# Patient Record
Sex: Male | Born: 1985 | Race: White | Hispanic: No | Marital: Married | Smoking: Never smoker
Health system: Southern US, Community
[De-identification: ages and names within clinical notes are randomized; demographics above are authoritative.]

## PROBLEM LIST (undated history)

## (undated) DIAGNOSIS — K601 Chronic anal fissure: Secondary | ICD-10-CM

## (undated) DIAGNOSIS — M109 Gout, unspecified: Secondary | ICD-10-CM

## (undated) HISTORY — DX: Chronic anal fissure: K60.1

## (undated) HISTORY — DX: Gout, unspecified: M10.9

---
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
## 2010-11-09 ENCOUNTER — Emergency Department: Payer: Self-pay | Admitting: Emergency Medicine

## 2013-03-18 HISTORY — PX: GASTRIC BYPASS: SHX52

## 2013-04-28 ENCOUNTER — Ambulatory Visit: Payer: Self-pay | Admitting: Bariatrics

## 2013-04-28 LAB — COMPREHENSIVE METABOLIC PANEL
Albumin: 3.6 g/dL (ref 3.4–5.0)
Alkaline Phosphatase: 85 U/L
Anion Gap: 2 — ABNORMAL LOW (ref 7–16)
BILIRUBIN TOTAL: 0.7 mg/dL (ref 0.2–1.0)
BUN: 16 mg/dL (ref 7–18)
CHLORIDE: 105 mmol/L (ref 98–107)
CREATININE: 1.09 mg/dL (ref 0.60–1.30)
Calcium, Total: 9.7 mg/dL (ref 8.5–10.1)
Co2: 24 mmol/L (ref 21–32)
EGFR (Non-African Amer.): >60
Glucose: 83 mg/dL (ref 65–99)
Osmolality: 268 (ref 275–301)
Potassium: 4.1 mmol/L (ref 3.5–5.1)
SGOT(AST): 40 U/L — ABNORMAL HIGH (ref 15–37)
SGPT (ALT): 50 U/L (ref 12–78)
Sodium: 138 mmol/L (ref 136–145)
Total Protein: 7.4 g/dL (ref 6.4–8.2)

## 2013-04-28 LAB — CBC WITH DIFFERENTIAL/PLATELET
BASOS PCT: 0.6 %
Basophil #: 0.1 10*3/uL (ref 0.0–0.1)
EOS ABS: 0.2 10*3/uL (ref 0.0–0.7)
EOS PCT: 2.3 %
HCT: 43.6 % (ref 40.0–52.0)
HGB: 14.5 g/dL (ref 13.0–18.0)
LYMPHS PCT: 24.6 %
Lymphocyte #: 2 10*3/uL (ref 1.0–3.6)
MCH: 28.4 pg (ref 26.0–34.0)
MCHC: 33.3 g/dL (ref 32.0–36.0)
MCV: 85 fL (ref 80–100)
Monocyte #: 0.7 x10 3/mm (ref 0.2–1.0)
Monocyte %: 9 %
NEUTROS ABS: 5.2 10*3/uL (ref 1.4–6.5)
Neutrophil %: 63.5 %
PLATELETS: 236 10*3/uL (ref 150–440)
RBC: 5.12 10*6/uL (ref 4.40–5.90)
RDW: 13.9 % (ref 11.5–14.5)
WBC: 8.2 10*3/uL (ref 3.8–10.6)

## 2013-04-28 LAB — PHOSPHORUS: Phosphorus: 3.2 mg/dL (ref 2.5–4.9)

## 2013-04-28 LAB — APTT: ACTIVATED PTT: 32.2 s (ref 23.6–35.9)

## 2013-04-28 LAB — FERRITIN: FERRITIN (ARMC): 98 ng/mL (ref 8–388)

## 2013-04-28 LAB — IRON AND TIBC
IRON SATURATION: 21 %
Iron Bind.Cap.(Total): 399 ug/dL (ref 250–450)
Iron: 83 ug/dL (ref 65–175)
UNBOUND IRON-BIND. CAP.: 316 ug/dL

## 2013-04-28 LAB — TSH: THYROID STIMULATING HORM: 1.35 u[IU]/mL

## 2013-04-28 LAB — PROTIME-INR
INR: 1
Prothrombin Time: 13 secs (ref 11.5–14.7)

## 2013-04-28 LAB — HEMOGLOBIN A1C: Hemoglobin A1C: 5.5 % (ref 4.2–6.3)

## 2013-04-28 LAB — AMYLASE: Amylase: 30 U/L (ref 25–115)

## 2013-04-28 LAB — MAGNESIUM: MAGNESIUM: 2 mg/dL

## 2013-04-28 LAB — BILIRUBIN, DIRECT: BILIRUBIN DIRECT: 0.1 mg/dL (ref 0.00–0.20)

## 2013-04-28 LAB — FOLATE: Folic Acid: 10.7 ng/mL (ref 3.1–100.0)

## 2013-04-28 LAB — LIPASE, BLOOD: Lipase: 103 U/L (ref 73–393)

## 2013-06-25 ENCOUNTER — Ambulatory Visit: Payer: Self-pay | Admitting: Bariatrics

## 2013-07-16 ENCOUNTER — Ambulatory Visit: Payer: Self-pay | Admitting: Bariatrics

## 2013-08-05 DIAGNOSIS — E669 Obesity, unspecified: Secondary | ICD-10-CM | POA: Insufficient documentation

## 2013-08-05 DIAGNOSIS — R7989 Other specified abnormal findings of blood chemistry: Secondary | ICD-10-CM | POA: Insufficient documentation

## 2014-07-02 IMAGING — RF DG UGI W/O KUB
10 series · 15 of 15 positions shown · non-contrast
Comparison: None

FLUOROSCOPY TIME:  1 min 24 seconds

CLINICAL DATA: Preop evaluation

EXAM:
UPPER GI SERIES WITHOUT KUB
TECHNIQUE: Routine upper GI series was performed with combined double contrast
and single contrast examination performed using effervescent
crystals, thick barium liquid, and thin barium liquid.

[Series 1: fluoro_barium 2fps_bw · 0.17mm/px · 4 of 11 frames shown (1 of 10)]
[frame 2/11]
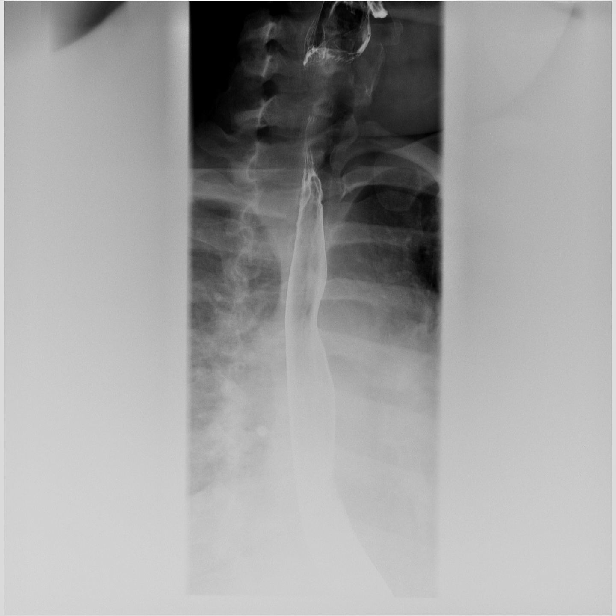
[frame 5/11]
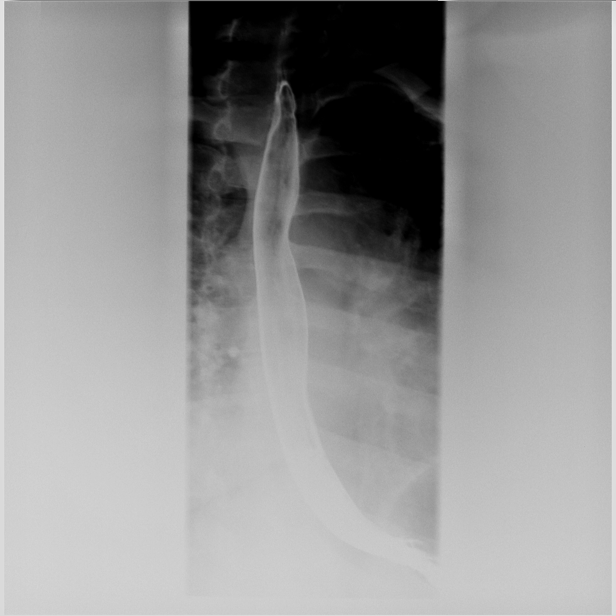
[frame 6/11]
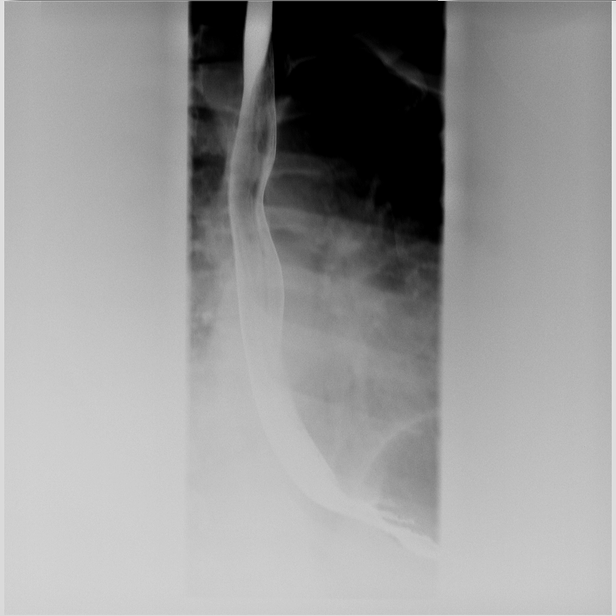
[frame 10/11]
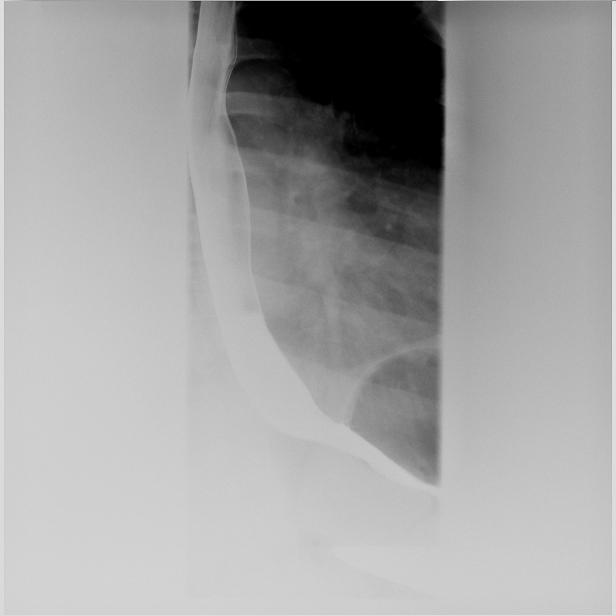

[Series 2: fluoro_barium 2fps_bw · 0.17mm/px · 2 of 2 frames shown (2 of 10)]
[frame 1/2]
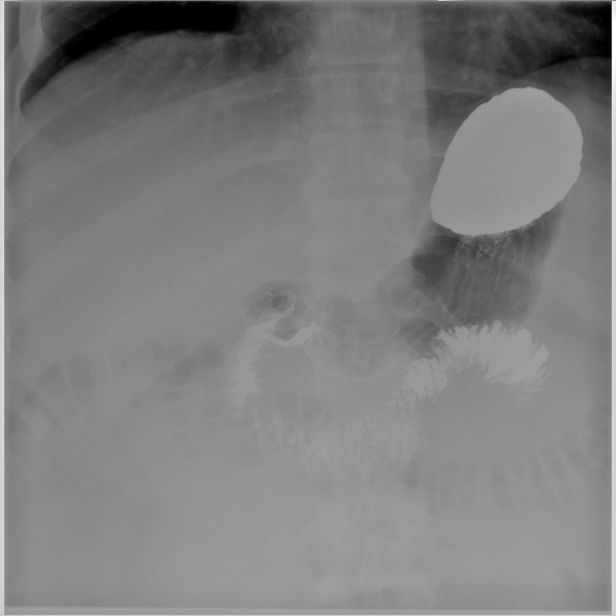
[frame 2/2]
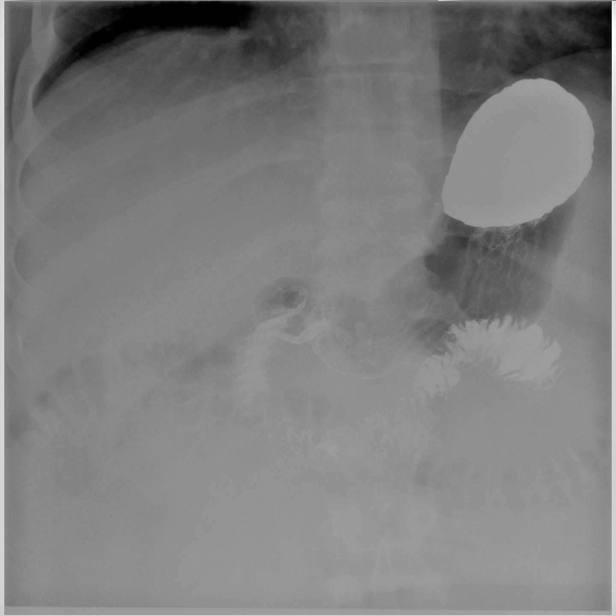

[Series 3: fluoro_barium 2fps_bw · 0.17mm/px · 1 of 1 slices shown (3 of 10)]
[im 1/1]
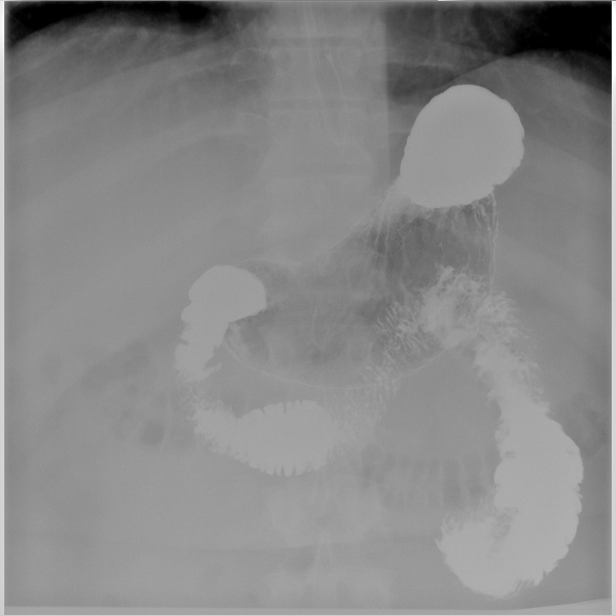

[Series 4: fluoro_barium 2fps_bw · 0.17mm/px · 1 of 1 slices shown (4 of 10)]
[im 1/1]
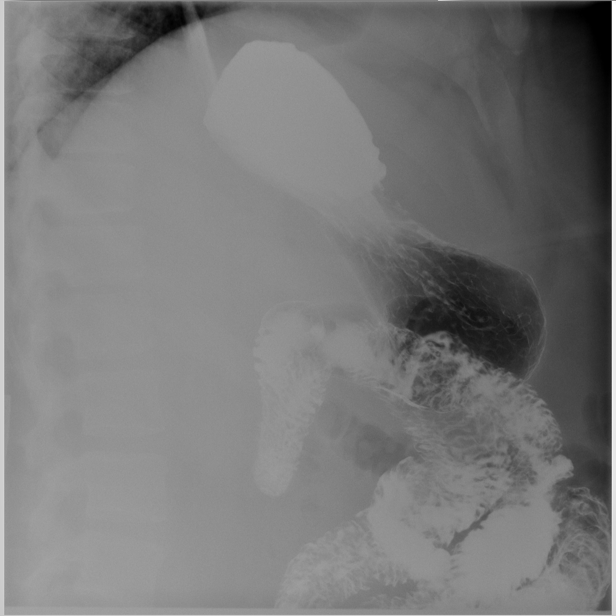

[Series 5: fluoro_barium 2fps_bw · 0.17mm/px · 1 of 1 slices shown (5 of 10)]
[im 1/1]
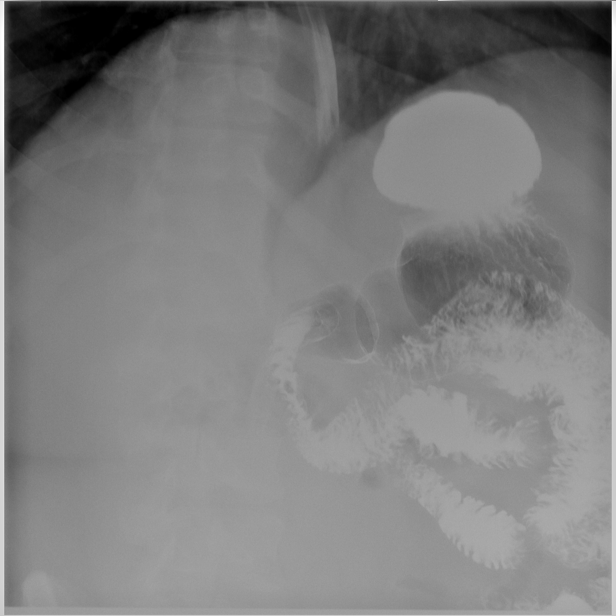

[Series 6: fluoro_barium 2fps_bw · 0.17mm/px · 1 of 1 slices shown (6 of 10)]
[im 1/1]
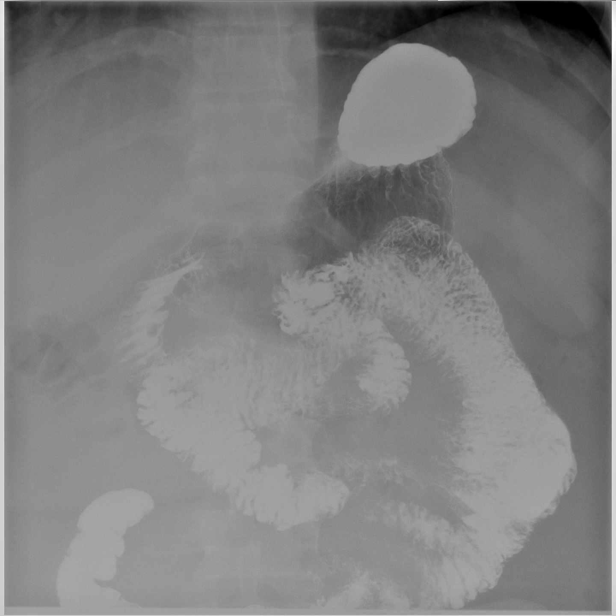

[Series 7: fluoro_barium 2fps_bw · 0.17mm/px · 1 of 1 slices shown (7 of 10)]
[im 1/1]
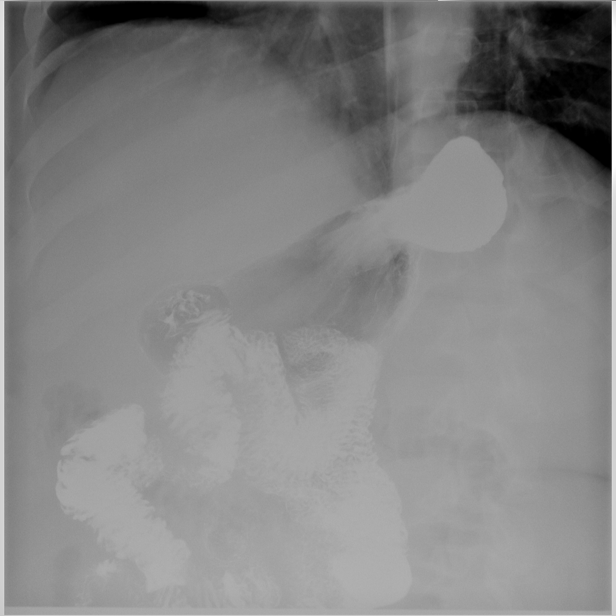

[Series 8: fluoro_barium 2fps_bw · 0.17mm/px · 1 of 1 slices shown (8 of 10)]
[im 1/1]
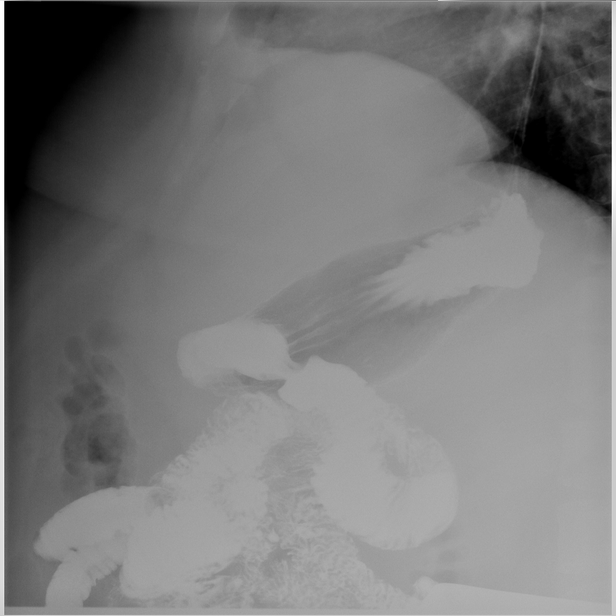

[Series 9: fluoro_barium 2fps_bw · 0.17mm/px · 1 of 1 slices shown (9 of 10)]
[im 1/1]
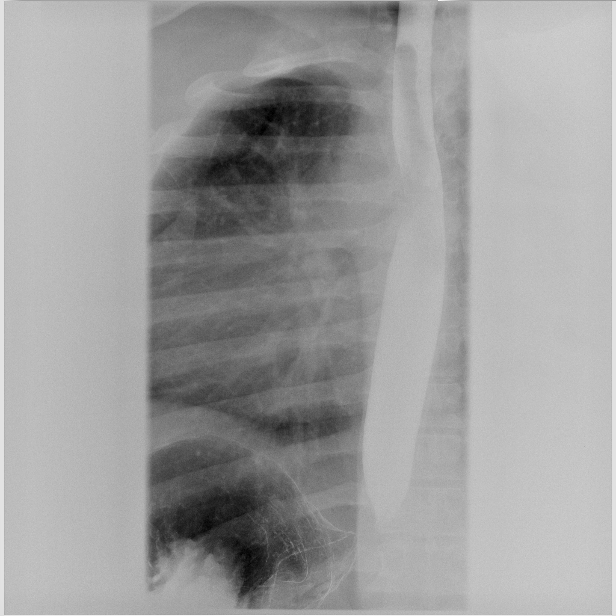

[Series 10: fluoro_barium 2fps_bw · 0.17mm/px · 2 of 2 frames shown (10 of 10)]
[frame 1/2]
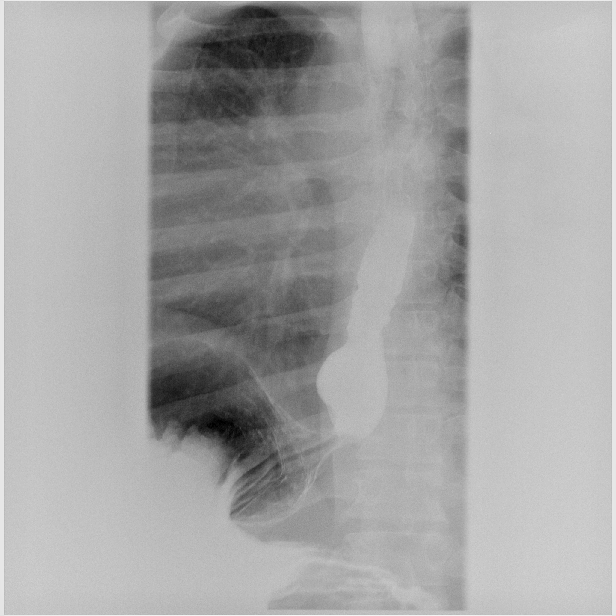
[frame 2/2]
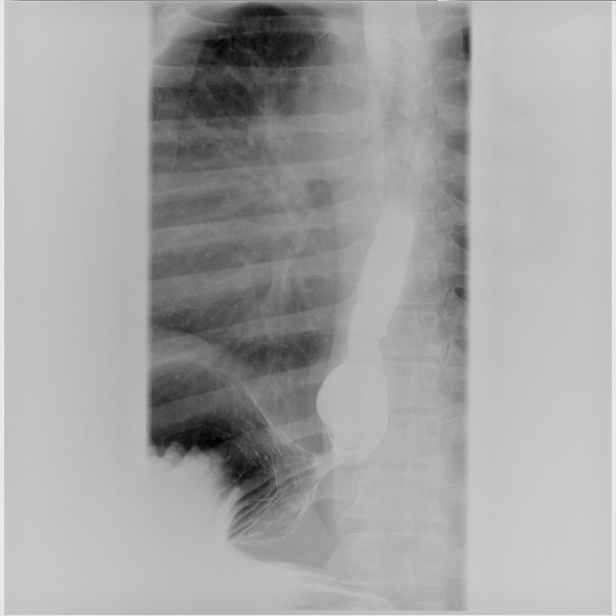

[15 of 15 positions shown; findings below may reference images not displayed]

FINDINGS: Limited evaluation of the esophagus is unremarkable. There is
relaxation of the lower esophageal sphincter. The stomach, duodenum,
and duodenal bulb are unremarkable. There is appropriate rotation of
the duodenum and insertion of the ligament of Treitz. There is no
evidence of a hiatal hernia nor gastroesophageal reflux. Contrast is
appreciated peristalsing into proximal and mid loops of the jejunum.
IMPRESSION: Negative upper GI evaluation

## 2015-09-25 ENCOUNTER — Ambulatory Visit (INDEPENDENT_AMBULATORY_CARE_PROVIDER_SITE_OTHER): Payer: BLUE CROSS/BLUE SHIELD | Admitting: General Surgery

## 2015-09-25 ENCOUNTER — Encounter: Payer: Self-pay | Admitting: General Surgery

## 2015-09-25 VITALS — BP 124/78 | HR 84 | Resp 12 | Ht 70.0 in | Wt 313.0 lb

## 2015-09-25 DIAGNOSIS — K602 Anal fissure, unspecified: Secondary | ICD-10-CM | POA: Diagnosis not present

## 2015-09-25 NOTE — Progress Notes (Signed)
Patient ID: Grant Hensley, male   DOB: 1985-12-02, 30 y.o.   MRN: [id number]  Chief Complaint  Patient presents with  . Other    anal fissure    HPI Grant Hensley is a 30 y.o. male here for evaluation of an anal fissure. He has been seen by Dr Katrinka BlazingSmith for this several times and is scheduled for surgery on 10/10/15. He states that the pain has become very intense even with his prescriptions. He only gets relief with sitting. This first started about a year ago. He had bariatric surgery in 2015 and has lost over 100 pounds since then and is continuing to loose weight.  I have reviewed the history of present illness with the patient.  HPI  Past Medical History  Diagnosis Date  . Gout     Past Surgical History  Procedure Laterality Date  . Gastric bypass  2015  . Knee surgery Left 2004  . Knee surgery Left 2004    History reviewed. No pertinent family history.  Social History Social History  Substance Use Topics  . Smoking status: Never Smoker   . Smokeless tobacco: Never Used  . Alcohol Use: No    No Known Allergies  Current Outpatient Prescriptions  Medication Sig Dispense Refill  . allopurinol (ZYLOPRIM) 300 MG tablet Take 300 mg by mouth daily.    . Nitroglycerin (RECTIV) 0.4 % OINT Place 1 application rectally 2 (two) times daily.     . NON FORMULARY Nifedipine 0.3% Cream Apply small amount to anal area twice daily    . docusate sodium (COLACE) 100 MG capsule Take 100 mg by mouth 2 (two) times daily.    . Multiple Vitamin (MULTIVITAMIN WITH MINERALS) TABS tablet Take 1 tablet by mouth daily.     No current facility-administered medications for this visit.    Review of Systems Review of Systems  Constitutional: Negative.   Respiratory: Negative.   Cardiovascular: Negative.   Gastrointestinal: Negative.     Blood pressure 124/78, pulse 84, resp. rate 12, height 5\' 10"  (1.778 m), weight 313 lb (141.976 kg).  Physical Exam Physical Exam  Constitutional:  He is oriented to person, place, and time. He appears well-developed and well-nourished.  Eyes: Conjunctivae are normal. No scleral icterus.  Neck: Neck supple.  Cardiovascular: Normal rate, regular rhythm and normal heart sounds.   Pulmonary/Chest: Effort normal and breath sounds normal.  Abdominal: Soft. Normal appearance and bowel sounds are normal. There is no tenderness.  Genitourinary: Rectal exam shows fissure and tenderness ( Very tender in the posterior aspect of the rectum. ).  Lymphadenopathy:    He has no cervical adenopathy.  Neurological: He is alert and oriented to person, place, and time.  Skin: Skin is warm and dry.    Data Reviewed Grant Hensley's notes Assessment    Worsening pain with anal fissure, not responding well to Rectiva/nifedipine     Plan   Pt is wanting surgery done soon. Will plan to do this this week. Procedure explianed top him. [facility].     PCP:  Barbette ReichmannHande, Vishwanath This has been scribed by Sinda Duaryl-Lyn M Kennedy LPN    Kieth BrightlySANKAR,Dimonique Bourdeau G 09/25/2015, 2:48 PM

## 2015-09-26 ENCOUNTER — Ambulatory Visit: Payer: BLUE CROSS/BLUE SHIELD | Admitting: Registered Nurse

## 2015-09-26 ENCOUNTER — Encounter: Payer: Self-pay | Admitting: *Deleted

## 2015-09-26 ENCOUNTER — Ambulatory Visit
Admission: RE | Admit: 2015-09-26 | Discharge: 2015-09-26 | Disposition: A | Discharge status: Home / Self Care | Payer: BLUE CROSS/BLUE SHIELD | Source: Ambulatory Visit | Attending: General Surgery | Admitting: General Surgery

## 2015-09-26 ENCOUNTER — Encounter
Admission: RE | Disposition: A | Discharge status: Home / Self Care | Payer: Self-pay | Source: Ambulatory Visit | Attending: General Surgery

## 2015-09-26 DIAGNOSIS — M109 Gout, unspecified: Secondary | ICD-10-CM | POA: Insufficient documentation

## 2015-09-26 DIAGNOSIS — K602 Anal fissure, unspecified: Secondary | ICD-10-CM

## 2015-09-26 DIAGNOSIS — K601 Chronic anal fissure: Secondary | ICD-10-CM | POA: Diagnosis not present

## 2015-09-26 DIAGNOSIS — Z9884 Bariatric surgery status: Secondary | ICD-10-CM | POA: Insufficient documentation

## 2015-09-26 HISTORY — PX: SPHINCTEROTOMY: SHX5279

## 2015-09-26 SURGERY — SPHINCTEROTOMY, ANAL
Anesthesia: General | Wound class: Clean Contaminated

## 2015-09-26 MED ORDER — SUCCINYLCHOLINE CHLORIDE 20 MG/ML IJ SOLN
INTRAMUSCULAR | Status: DC | PRN
  Administered 2015-09-26: 140 mg via INTRAVENOUS

## 2015-09-26 MED ORDER — ACETAMINOPHEN 10 MG/ML IV SOLN
INTRAVENOUS | Status: AC
  Filled 2015-09-26: qty 100

## 2015-09-26 MED ORDER — PROPOFOL 10 MG/ML IV BOLUS
INTRAVENOUS | Status: DC | PRN
  Administered 2015-09-26 (×2): 30 mg via INTRAVENOUS
  Administered 2015-09-26: 250 mg via INTRAVENOUS

## 2015-09-26 MED ORDER — FLEET ENEMA 7-19 GM/118ML RE ENEM: 1.0000 | ENEMA | Freq: Once | RECTAL | Status: DC

## 2015-09-26 MED ORDER — LACTATED RINGERS IV SOLN
INTRAVENOUS | Status: DC
  Administered 2015-09-26: 12:00:00 via INTRAVENOUS

## 2015-09-26 MED ORDER — ONDANSETRON HCL 4 MG/2ML IJ SOLN: 4.0000 mg | Freq: Once | INTRAMUSCULAR | Status: DC | PRN

## 2015-09-26 MED ORDER — LIDOCAINE HCL (PF) 1 % IJ SOLN
INTRAMUSCULAR | Status: AC
  Filled 2015-09-26: qty 30

## 2015-09-26 MED ORDER — BACIT-POLY-NEO HC 1 % EX OINT
TOPICAL_OINTMENT | CUTANEOUS | Status: DC | PRN
  Administered 2015-09-26: 1

## 2015-09-26 MED ORDER — DEXAMETHASONE SODIUM PHOSPHATE 10 MG/ML IJ SOLN
INTRAMUSCULAR | Status: DC | PRN
  Administered 2015-09-26: 5 mg via INTRAVENOUS

## 2015-09-26 MED ORDER — FENTANYL CITRATE (PF) 100 MCG/2ML IJ SOLN
INTRAMUSCULAR | Status: DC | PRN
  Administered 2015-09-26: 50 ug via INTRAVENOUS
  Administered 2015-09-26: 25 ug via INTRAVENOUS
  Administered 2015-09-26: 100 ug via INTRAVENOUS
  Administered 2015-09-26: 25 ug via INTRAVENOUS

## 2015-09-26 MED ORDER — BUPIVACAINE HCL (PF) 0.5 % IJ SOLN
INTRAMUSCULAR | Status: DC | PRN
  Administered 2015-09-26: 16 mL

## 2015-09-26 MED ORDER — ACETAMINOPHEN 10 MG/ML IV SOLN
INTRAVENOUS | Status: DC | PRN
  Administered 2015-09-26: 1000 mg via INTRAVENOUS

## 2015-09-26 MED ORDER — BACITRACIN ZINC 500 UNIT/GM EX OINT
TOPICAL_OINTMENT | CUTANEOUS | Status: AC
  Filled 2015-09-26: qty 28.35

## 2015-09-26 MED ORDER — ONDANSETRON HCL 4 MG/2ML IJ SOLN
INTRAMUSCULAR | Status: DC | PRN
  Administered 2015-09-26: 4 mg via INTRAVENOUS

## 2015-09-26 MED ORDER — OXYCODONE-ACETAMINOPHEN 5-325 MG PO TABS: 1.0000 | ORAL_TABLET | ORAL | Status: DC | PRN

## 2015-09-26 MED ORDER — MIDAZOLAM HCL 2 MG/2ML IJ SOLN
INTRAMUSCULAR | Status: DC | PRN
  Administered 2015-09-26: 2 mg via INTRAVENOUS

## 2015-09-26 MED ORDER — FENTANYL CITRATE (PF) 100 MCG/2ML IJ SOLN: 25.0000 ug | INTRAMUSCULAR | Status: DC | PRN

## 2015-09-26 MED ORDER — LIDOCAINE HCL (CARDIAC) 20 MG/ML IV SOLN
INTRAVENOUS | Status: DC | PRN
  Administered 2015-09-26: 10 mg via INTRAVENOUS

## 2015-09-26 MED ORDER — GLYCOPYRROLATE 0.2 MG/ML IJ SOLN
INTRAMUSCULAR | Status: DC | PRN
  Administered 2015-09-26: 0.2 mg via INTRAVENOUS

## 2015-09-26 MED ORDER — BUPIVACAINE HCL (PF) 0.5 % IJ SOLN
INTRAMUSCULAR | Status: AC
  Filled 2015-09-26: qty 30

## 2015-09-26 SURGICAL SUPPLY — 23 items
BLADE SURG 15 STRL SS SAFETY (BLADE) ×2 IMPLANT
BRIEF STRETCH MATERNITY 2XLG (MISCELLANEOUS) ×2 IMPLANT
CANISTER SUCT 1200ML W/VALVE (MISCELLANEOUS) ×2 IMPLANT
DRAPE LAPAROTOMY 77X122 PED (DRAPES) ×2 IMPLANT
DRAPE LEGGINS SURG 28X43 STRL (DRAPES) ×2 IMPLANT
DRAPE UNDER BUTTOCK W/FLU (DRAPES) ×2 IMPLANT
ELECT REM PT RETURN 9FT ADLT (ELECTROSURGICAL) ×2
ELECTRODE REM PT RTRN 9FT ADLT (ELECTROSURGICAL) ×1 IMPLANT
GLOVE BIO SURGEON STRL SZ7 (GLOVE) ×10 IMPLANT
GOWN STRL REUS W/ TWL LRG LVL3 (GOWN DISPOSABLE) ×3 IMPLANT
GOWN STRL REUS W/TWL LRG LVL3 (GOWN DISPOSABLE) ×3
KIT RM TURNOVER CYSTO AR (KITS) ×2 IMPLANT
LABEL OR SOLS (LABEL) ×2 IMPLANT
NEEDLE HYPO 25X1 1.5 SAFETY (NEEDLE) ×2 IMPLANT
PACK BASIN MINOR ARMC (MISCELLANEOUS) ×2 IMPLANT
PAD OB MATERNITY 4.3X12.25 (PERSONAL CARE ITEMS) ×2 IMPLANT
PAD PREP 24X41 OB/GYN DISP (PERSONAL CARE ITEMS) ×2 IMPLANT
SOL PREP PVP 2OZ (MISCELLANEOUS) ×2
SOLUTION PREP PVP 2OZ (MISCELLANEOUS) ×1 IMPLANT
SURGILUBE 2OZ TUBE FLIPTOP (MISCELLANEOUS) ×2 IMPLANT
SUT VIC AB 3-0 SH 27 (SUTURE) ×1
SUT VIC AB 3-0 SH 27X BRD (SUTURE) ×1 IMPLANT
SYR CONTROL 10ML (SYRINGE) ×2 IMPLANT

## 2015-09-26 NOTE — Transfer of Care (Signed)
Immediate Anesthesia Transfer of Care Note  Patient: Grant Hensley  Procedure(s) Performed: Procedure(s): SPHINCTEROTOMY (N/A)  Patient Location: PACU  Anesthesia Type:General  Level of Consciousness: sedated  Airway & Oxygen Therapy: Patient Spontanous Breathing and Patient connected to face mask oxygen  Post-op Assessment: Report given to RN and Post -op Vital signs reviewed and stable  Post vital signs: Reviewed and stable  Last Vitals:  Filed Vitals:   09/26/15 1008 09/26/15 1250  BP: 114/74 116/54  Pulse: 54 69  Temp: 36.6 C 36.4 C  Resp: 18 16    Complications: No apparent anesthesia complications

## 2015-09-26 NOTE — Anesthesia Preprocedure Evaluation (Signed)
Anesthesia Evaluation  Patient identified by MRN, date of birth, ID band Patient awake    Reviewed: Allergy & Precautions, H&P , NPO status , Patient's Chart, lab work & pertinent test results, reviewed documented beta blocker date and time   Airway Mallampati: III   Neck ROM: full    Dental  (+) Teeth Intact   Pulmonary neg pulmonary ROS,    Pulmonary exam normal        Cardiovascular negative cardio ROS Normal cardiovascular exam Rhythm:regular Rate:Normal     Neuro/Psych negative neurological ROS  negative psych ROS   GI/Hepatic negative GI ROS, Neg liver ROS,   Endo/Other  negative endocrine ROS  Renal/GU negative Renal ROS  negative genitourinary   Musculoskeletal   Abdominal   Peds  Hematology negative hematology ROS (+)   Anesthesia Other Findings Past Medical History:   Gout                                                       Past Surgical History:   GASTRIC BYPASS                                   2015         KNEE SURGERY                                    Left 2004         KNEE SURGERY                                    Left 2004         Reproductive/Obstetrics                             Anesthesia Physical Anesthesia Plan  ASA: III  Anesthesia Plan: General   Post-op Pain Management:    Induction:   Airway Management Planned:   Additional Equipment:   Intra-op Plan:   Post-operative Plan:   Informed Consent: I have reviewed the patients History and Physical, chart, labs and discussed the procedure including the risks, benefits and alternatives for the proposed anesthesia with the patient or authorized representative who has indicated his/her understanding and acceptance.   Dental Advisory Given  Plan Discussed with: CRNA  Anesthesia Plan Comments:         Anesthesia Quick Evaluation

## 2015-09-26 NOTE — Op Note (Signed)
Preop diagnosis: Posterior anal fissure  Post op diagnosis: Same  Operation: Lateral internal anal sphincterotomy  Surgeon: Kathreen CosierS. G. Austyn Seier  Assistant:     Anesthesia: Gen.  Complications: None  EBL: Minimal  Drains: None  Description: Patient was put to sleep with an endotracheal tube and then placed in the lithotomy position. Timeout was performed. Anal area prepped and draped as sterile field. 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 The patient did have a significant amount of tightening of the anal opening because of tight bandlike rim of the internal sphincter muscle. Then for a small hypertrophied crypt and on the right side no other pathology within the anal canal or lower rectum noted. With the bivalve speculum in place lateral internal sphincterotomy was completed at the 3 and 9:00 locations. Submucous and intersphincteric grooves were injected with  0.5% Marcaine as also some of the subcutaneous tissue in the surrounding area, totaling 16 mL. To radial incisions were made at the 3 and 9:00 positions in the mucosa and the thickened rim of the internal sphincter was elevated and cut with cautery. Both the radial incisions were closed with a buried stitches of 3-0 Vicryl. The posterior anal fissure area was then exposed and slightly cauterized to get rid of some unhealthy surface tissue. No large sentinel tag of skin was noted. The area was covered with bacitracin ointment and patient subsequently taken down from lithotomy reversed extubated and returned recovery room in stable condition.

## 2015-09-26 NOTE — Interval H&P Note (Signed)
History and Physical Interval Note:  09/26/2015 11:41 AM  Grant Hensley  has presented today for surgery, with the diagnosis of ANAL FISSURE  The various methods of treatment have been discussed with the patient and family. After consideration of risks, benefits and other options for treatment, the patient has consented to  Procedure(s): SPHINCTEROTOMY (N/A) as a surgical intervention .  The patient's history has been reviewed, patient examined, no change in status, stable for surgery.  I have reviewed the patient's chart and labs.  Questions were answered to the patient's satisfaction.     [name],[name]

## 2015-09-26 NOTE — H&P (View-Only) (Signed)
Patient ID: Grant Hensley, male   DOB: 12/18/1985, 30 y.o.   MRN: [id number]  Chief Complaint  Patient presents with  . Other    anal fissure    HPI Grant Hensley is a 30 y.o. male here for evaluation of an anal fissure. He has been seen by Dr Smith for this several times and is scheduled for surgery on 10/10/15. He states that the pain has become very intense even with his prescriptions. He only gets relief with sitting. This first started about a year ago. He had bariatric surgery in 2015 and has lost over 100 pounds since then and is continuing to loose weight.  I have reviewed the history of present illness with the patient.  HPI  Past Medical History  Diagnosis Date  . Gout     Past Surgical History  Procedure Laterality Date  . Gastric bypass  2015  . Knee surgery Left 2004  . Knee surgery Left 2004    History reviewed. No pertinent family history.  Social History Social History  Substance Use Topics  . Smoking status: Never Smoker   . Smokeless tobacco: Never Used  . Alcohol Use: No    No Known Allergies  Current Outpatient Prescriptions  Medication Sig Dispense Refill  . allopurinol (ZYLOPRIM) 300 MG tablet Take 300 mg by mouth daily.    . Nitroglycerin (RECTIV) 0.4 % OINT Place 1 application rectally 2 (two) times daily.     . NON FORMULARY Nifedipine 0.3% Cream Apply small amount to anal area twice daily    . docusate sodium (COLACE) 100 MG capsule Take 100 mg by mouth 2 (two) times daily.    . Multiple Vitamin (MULTIVITAMIN WITH MINERALS) TABS tablet Take 1 tablet by mouth daily.     No current facility-administered medications for this visit.    Review of Systems Review of Systems  Constitutional: Negative.   Respiratory: Negative.   Cardiovascular: Negative.   Gastrointestinal: Negative.     Blood pressure 124/78, pulse 84, resp. rate 12, height 5' 10" (1.778 m), weight 313 lb (141.976 kg).  Physical Exam Physical Exam  Constitutional:  He is oriented to person, place, and time. He appears well-developed and well-nourished.  Eyes: Conjunctivae are normal. No scleral icterus.  Neck: Neck supple.  Cardiovascular: Normal rate, regular rhythm and normal heart sounds.   Pulmonary/Chest: Effort normal and breath sounds normal.  Abdominal: Soft. Normal appearance and bowel sounds are normal. There is no tenderness.  Genitourinary: Rectal exam shows fissure and tenderness ( Very tender in the posterior aspect of the rectum. ).  Lymphadenopathy:    He has no cervical adenopathy.  Neurological: He is alert and oriented to person, place, and time.  Skin: Skin is warm and dry.    Data Reviewed Dr. Wilton Smith's notes Assessment    Worsening pain with anal fissure, not responding well to Rectiva/nifedipine     Plan   Pt is wanting surgery done soon. Will plan to do this this week. Procedure explianed top him. Patient's surgery has been scheduled for 09-26-15 at [facility].     PCP:  Hande, Vishwanath This has been scribed by Caryl-Lyn M Kennedy LPN    [name],[name] 09/25/2015, 2:48 PM    

## 2015-09-26 NOTE — Anesthesia Procedure Notes (Signed)
Procedure Name: Intubation Date/Time: 09/26/2015 12:05 PM Performed by: Stormy FabianURTIS, Francies Inch Pre-anesthesia Checklist: Patient identified, Patient being monitored, Timeout performed, Emergency Drugs available and Suction available Patient Re-evaluated:Patient Re-evaluated prior to inductionOxygen Delivery Method: Circle system utilized Preoxygenation: Pre-oxygenation with 100% oxygen Intubation Type: IV induction Ventilation: Mask ventilation without difficulty Laryngoscope Size: Mac and 3 Grade View: Grade I Tube type: Oral Tube size: 7.5 mm Number of attempts: 1 Airway Equipment and Method: Stylet Placement Confirmation: ETT inserted through vocal cords under direct vision,  positive ETCO2 and breath sounds checked- equal and bilateral Secured at: 23 cm Tube secured with: Tape Dental Injury: Teeth and Oropharynx as per pre-operative assessment

## 2015-09-26 NOTE — Discharge Instructions (Signed)

## 2015-10-02 ENCOUNTER — Other Ambulatory Visit: Payer: Self-pay

## 2015-10-02 NOTE — Anesthesia Postprocedure Evaluation (Signed)
Anesthesia Post Note  Patient: Grant Hensley  Procedure(s) Performed: Procedure(s) (LRB): SPHINCTEROTOMY (N/A)  Patient location during evaluation: PACU Anesthesia Type: General Level of consciousness: awake and alert Pain management: pain level controlled Vital Signs Assessment: post-procedure vital signs reviewed and stable Respiratory status: spontaneous breathing, nonlabored ventilation, respiratory function stable and patient connected to nasal cannula oxygen Cardiovascular status: blood pressure returned to baseline and stable Postop Assessment: no signs of nausea or vomiting Anesthetic complications: no    Last Vitals:  Filed Vitals:   09/26/15 1330 09/26/15 1341  BP: 95/84 112/74  Pulse: 61 57  Temp: 36.4 C 35.6 C  Resp: 20 16    Last Pain:  Filed Vitals:   09/27/15 0807  PainSc: 0-No pain                 Yevette EdwardsJames G Abyan Cadman

## 2015-10-10 ENCOUNTER — Ambulatory Visit: Admit: 2015-10-10 | Payer: Self-pay | Admitting: Surgery

## 2015-10-10 SURGERY — SPHINCTEROTOMY, ANAL
Anesthesia: Choice

## 2015-10-11 ENCOUNTER — Encounter: Payer: Self-pay | Admitting: General Surgery

## 2015-10-11 ENCOUNTER — Ambulatory Visit (INDEPENDENT_AMBULATORY_CARE_PROVIDER_SITE_OTHER): Payer: BLUE CROSS/BLUE SHIELD | Admitting: General Surgery

## 2015-10-11 VITALS — BP 126/72 | HR 86 | Resp 12 | Ht 70.0 in | Wt 310.0 lb

## 2015-10-11 DIAGNOSIS — K601 Chronic anal fissure: Secondary | ICD-10-CM

## 2015-10-11 NOTE — Patient Instructions (Signed)
Add fiber supplement to diet. The patient is aware to call back for any questions or concerns.

## 2015-10-11 NOTE — Progress Notes (Signed)
Patient ID: Grant Hensley, male   DOB: [birth year], 30 y.o.   MRN: [id number]  Chief Complaint  Patient presents with  . Routine Post Op    HPI Grant Hensley is a 30 y.o. male here today for his postoperative visit for an anal sphincterotomy on 09/26/2015 done for a chronic anal fissure. He states that he is doing well. Bowels move regularly and painlessly. Patient is still using stool softeners. No more pain with BM I have reviewed the history of present illness with the patient.  HPI  Past Medical History:  Diagnosis Date  . Chronic anal fissure    anal sphincterotomy 09/2015  . Gout     Past Surgical History:  Procedure Laterality Date  . GASTRIC BYPASS  2015  . KNEE SURGERY Left 2004  . KNEE SURGERY Left 2004  . SPHINCTEROTOMY N/A 09/26/2015   Procedure: SPHINCTEROTOMY;  Surgeon: Kieth Brightly, MD;  Location: [facility] ORS;  Service: General;  Laterality: N/A;    No family history on file.  Social History Social History  Substance Use Topics  . Smoking status: Never Smoker  . Smokeless tobacco: Never Used  . Alcohol use No    No Known Allergies  Current Outpatient Prescriptions  Medication Sig Dispense Refill  . allopurinol (ZYLOPRIM) 300 MG tablet Take 300 mg by mouth daily.    Marland Kitchen docusate sodium (COLACE) 100 MG capsule Take 100 mg by mouth 2 (two) times daily.    . Multiple Vitamin (MULTIVITAMIN WITH MINERALS) TABS tablet Take 1 tablet by mouth daily.     No current facility-administered medications for this visit.     Review of Systems Review of Systems  Constitutional: Negative.   Respiratory: Negative.   Cardiovascular: Negative.   Gastrointestinal: Negative for abdominal pain, anal bleeding, constipation, diarrhea and rectal pain.    Blood pressure 126/72, pulse 86, resp. rate 12, height 5\' 10"  (1.778 m), weight (!) 310 lb (140.6 kg).  Physical Exam Physical Exam  Constitutional: He is oriented to person, place, and time. He appears  well-developed and well-nourished.  Genitourinary: Rectal exam shows no external hemorrhoid and no fissure.  Genitourinary Comments: No fissure seen, area healing well.  Neurological: He is alert and oriented to person, place, and time.  Skin: Skin is warm and dry.  Psychiatric: His behavior is normal.    Data Reviewed none  Assessment    Stable postoperative exam. No fissure seen, area healing well.Preop rectal pain fully resolved    Plan    Follow up as needed. Can discontinue use of stool softener. Patient was advised to maintain  use of a fiber supplement     PCP:  Hande, Vishwanath This information has been scribed by Dorathy Daft RN, BSN,BC.   Kalim Kissel G 10/13/2015, 11:59 AM

## 2015-10-13 ENCOUNTER — Encounter: Payer: Self-pay | Admitting: General Surgery

## 2015-10-13 DIAGNOSIS — K601 Chronic anal fissure: Secondary | ICD-10-CM | POA: Insufficient documentation

## 2015-10-13 DIAGNOSIS — K6289 Other specified diseases of anus and rectum: Secondary | ICD-10-CM | POA: Insufficient documentation

## 2021-01-09 ENCOUNTER — Ambulatory Visit
Admission: EM | Admit: 2021-01-09 | Discharge: 2021-01-09 | Disposition: A | Discharge status: Home / Self Care | Payer: 59 | Attending: Physician Assistant | Admitting: Physician Assistant

## 2021-01-09 ENCOUNTER — Other Ambulatory Visit: Payer: Self-pay

## 2021-01-09 DIAGNOSIS — L237 Allergic contact dermatitis due to plants, except food: Secondary | ICD-10-CM | POA: Diagnosis not present

## 2021-01-09 MED ORDER — PREDNISONE 20 MG PO TABS: 40.0000 mg | ORAL_TABLET | Freq: Every day | ORAL | 0 refills | Status: AC

## 2021-01-09 MED ORDER — METHYLPREDNISOLONE SODIUM SUCC 125 MG IJ SOLR
80.0000 mg | Freq: Once | INTRAMUSCULAR | Status: AC
  Administered 2021-01-09: 80 mg via INTRAMUSCULAR

## 2021-01-09 NOTE — Discharge Instructions (Addendum)
Start oral steroid (prednisone) tomorrow (01/10/21). Follow up with any concerns.

## 2021-01-09 NOTE — ED Provider Notes (Signed)
[facility]    CSN: [id number] Arrival date & time: 01/09/21  0813      History   Chief Complaint Chief Complaint  Patient presents with   Rash    HPI Grant Hensley is a 35 y.o. male.   Patient here today for evaluation of rash that he suspect is from poison ivy that has been present for the last 3 days.  He states he was gathering leaves in his yard and thinks this is where contact occurred.  He has not had fever or chills.  He denies any shortness of breath or difficulty breathing.  No trouble swallowing.  He has had reactions to poison ivy on several occasions in the past but not quite so diffuse. He currently states rash is present to torso, back , arms, legs, genitals, face, etc.  He states steroid injections have been helpful before.  The history is provided by the patient.  Rash Associated symptoms: no fever and no shortness of breath    Past Medical History:  Diagnosis Date   Chronic anal fissure    anal sphincterotomy 09/2015   Gout     Patient Active Problem List   Diagnosis Date Noted   Chronic anal fissure 10/13/2015   Rectal pain 10/13/2015   Obesity 08/05/2013   Low serum vitamin D 08/05/2013    Past Surgical History:  Procedure Laterality Date   GASTRIC BYPASS  2015   KNEE SURGERY Left 2004   KNEE SURGERY Left 2004   SPHINCTEROTOMY N/A 09/26/2015   Procedure: SPHINCTEROTOMY;  Surgeon: Kieth Brightly, MD;  Location: [facility] ORS;  Service: General;  Laterality: N/A;       Home Medications    Prior to Admission medications   Medication Sig Start Date End Date Taking? Authorizing Provider  predniSONE (DELTASONE) 20 MG tablet Take 2 tablets (40 mg total) by mouth daily with breakfast for 5 days. 01/09/21 01/14/21 Yes Tomi Bamberger, PA-C  allopurinol (ZYLOPRIM) 300 MG tablet Take 300 mg by mouth daily.    [provider]  docusate sodium (COLACE) 100 MG capsule Take 100 mg by mouth 2 (two) times daily.    [provider]  Multiple Vitamin (MULTIVITAMIN WITH MINERALS) TABS tablet Take 1 tablet by mouth daily.    [provider]    Family History History reviewed. No pertinent family history.  Social History Social History   Tobacco Use   Smoking status: Never   Smokeless tobacco: Never  Substance Use Topics   Alcohol use: No    Alcohol/week: 0.0 standard drinks   Drug use: No     Allergies   Patient has no known allergies.   Review of Systems Review of Systems  Constitutional:  Negative for chills and fever.  HENT:  Negative for trouble swallowing.   Eyes:  Negative for discharge and redness.  Respiratory:  Negative for shortness of breath.   Skin:  Positive for color change, rash and wound.  Neurological:  Negative for numbness.    Physical Exam Triage Vital Signs ED Triage Vitals [01/09/21 0903]  Enc Vitals Group     BP 128/79     Pulse Rate (!) 55     Resp 18     Temp 98 F (36.7 C)     Temp Source Oral     SpO2 98 %     Weight      Height      Head Circumference      Peak  Flow      Pain Score 0     Pain Loc      Pain Edu?      Excl. in GC?    No data found.  Updated Vital Signs BP 128/79 (BP Location: Left Arm)   Pulse (!) 55   Temp 98 F (36.7 C) (Oral)   Resp 18   SpO2 98%      Physical Exam Vitals and nursing note reviewed.  Constitutional:      General: He is not in acute distress.    Appearance: Normal appearance. He is not ill-appearing.  HENT:     Head: Normocephalic and atraumatic.  Eyes:     Conjunctiva/sclera: Conjunctivae normal.  Cardiovascular:     Rate and Rhythm: Normal rate.  Pulmonary:     Effort: Pulmonary effort is normal.  Skin:    Comments: Scattered erythematous papular lesions to eyelids, bilateral hands, and lower legs in linear distribution  Neurological:     Mental Status: He is alert.  Psychiatric:        Mood and Affect: Mood normal.        Behavior: Behavior normal.        Thought Content:  Thought content normal.     UC Treatments / Results  Labs (all labs ordered are listed, but only abnormal results are displayed) Labs Reviewed - No data to display  EKG   Radiology No results found.  Procedures Procedures (including critical care time)  Medications Ordered in UC Medications  methylPREDNISolone sodium succinate (SOLU-MEDROL) 125 mg/2 mL injection 80 mg (80 mg Intramuscular Given 01/09/21 0929)    Initial Impression / Assessment and Plan / UC Course  I have reviewed the triage vital signs and the nursing notes.  Pertinent labs & imaging results that were available during my care of the patient were reviewed by me and considered in my medical decision making (see chart for details).    Steroid injection administered in office, and oral steroids prescribed given diffuse nature of rash.  Recommended follow-up if symptoms fail to improve or worsen anyway.  Final Clinical Impressions(s) / UC Diagnoses   Final diagnoses:  Poison ivy dermatitis     Discharge Instructions      Start oral steroid (prednisone) tomorrow (01/10/21). Follow up with any concerns.      ED Prescriptions     Medication Sig Dispense Auth. Provider   predniSONE (DELTASONE) 20 MG tablet Take 2 tablets (40 mg total) by mouth daily with breakfast for 5 days. 10 tablet Tomi Bamberger, PA-C      PDMP not reviewed this encounter.   Tomi Bamberger, PA-C 01/09/21 1017

## 2021-01-09 NOTE — ED Triage Notes (Signed)
Pto systemic poison ivy onset about 3 days ago. States it is on chest, back, arms, legs, groin. States he is highly reactive to it and requests a "steroid shot."

## 2022-03-05 ENCOUNTER — Other Ambulatory Visit: Payer: Self-pay

## 2022-03-05 ENCOUNTER — Emergency Department: Payer: 59

## 2022-03-05 ENCOUNTER — Emergency Department
Admission: EM | Admit: 2022-03-05 | Discharge: 2022-03-06 | Disposition: A | Discharge status: Home / Self Care | Payer: 59 | Attending: Emergency Medicine | Admitting: Emergency Medicine

## 2022-03-05 ENCOUNTER — Encounter: Payer: Self-pay | Admitting: *Deleted

## 2022-03-05 DIAGNOSIS — Z20822 Contact with and (suspected) exposure to covid-19: Secondary | ICD-10-CM | POA: Diagnosis not present

## 2022-03-05 DIAGNOSIS — E860 Dehydration: Secondary | ICD-10-CM | POA: Diagnosis not present

## 2022-03-05 DIAGNOSIS — R112 Nausea with vomiting, unspecified: Secondary | ICD-10-CM | POA: Diagnosis present

## 2022-03-05 LAB — URINALYSIS, ROUTINE W REFLEX MICROSCOPIC
Bilirubin Urine: NEGATIVE
Glucose, UA: NEGATIVE mg/dL
Hgb urine dipstick: NEGATIVE
Ketones, ur: NEGATIVE mg/dL
Leukocytes,Ua: NEGATIVE
Nitrite: NEGATIVE
Protein, ur: NEGATIVE mg/dL
Specific Gravity, Urine: 1.028 (ref 1.005–1.030)
pH: 5 (ref 5.0–8.0)

## 2022-03-05 LAB — CBC
HCT: 50.5 % (ref 39.0–52.0)
Hemoglobin: 17 g/dL (ref 13.0–17.0)
MCH: 28.7 pg (ref 26.0–34.0)
MCHC: 33.7 g/dL (ref 30.0–36.0)
MCV: 85.2 fL (ref 80.0–100.0)
Platelets: 206 10*3/uL (ref 150–400)
RBC: 5.93 MIL/uL — ABNORMAL HIGH (ref 4.22–5.81)
RDW: 13 % (ref 11.5–15.5)
WBC: 11.6 10*3/uL — ABNORMAL HIGH (ref 4.0–10.5)
nRBC: 0 % (ref 0.0–0.2)

## 2022-03-05 LAB — COMPREHENSIVE METABOLIC PANEL
ALT: 25 U/L (ref 0–44)
AST: 26 U/L (ref 15–41)
Albumin: 4.7 g/dL (ref 3.5–5.0)
Alkaline Phosphatase: 69 U/L (ref 38–126)
Anion gap: 10 (ref 5–15)
BUN: 22 mg/dL — ABNORMAL HIGH (ref 6–20)
CO2: 25 mmol/L (ref 22–32)
Calcium: 9.3 mg/dL (ref 8.9–10.3)
Chloride: 102 mmol/L (ref 98–111)
Creatinine, Ser: 1.12 mg/dL (ref 0.61–1.24)
GFR, Estimated: >60 mL/min (ref >60)
Glucose, Bld: 153 mg/dL — ABNORMAL HIGH (ref 70–99)
Potassium: 4.5 mmol/L (ref 3.5–5.1)
Sodium: 137 mmol/L (ref 135–145)
Total Bilirubin: 1.2 mg/dL (ref 0.3–1.2)
Total Protein: 7.5 g/dL (ref 6.5–8.1)

## 2022-03-05 LAB — TROPONIN I (HIGH SENSITIVITY)
Troponin I (High Sensitivity): 4 ng/L (ref <18)
Troponin I (High Sensitivity): 3 ng/L (ref <18)

## 2022-03-05 LAB — RESP PANEL BY RT-PCR (RSV, FLU A&B, COVID)  RVPGX2
Influenza A by PCR: NEGATIVE
Influenza B by PCR: NEGATIVE
Resp Syncytial Virus by PCR: NEGATIVE
SARS Coronavirus 2 by RT PCR: NEGATIVE

## 2022-03-05 LAB — LIPASE, BLOOD: Lipase: 41 U/L (ref 11–51)

## 2022-03-05 LAB — LACTIC ACID, PLASMA: Lactic Acid, Venous: 1.2 mmol/L (ref 0.5–1.9)

## 2022-03-05 MED ORDER — SODIUM CHLORIDE 0.9 % IV BOLUS
1000.0000 mL | Freq: Once | INTRAVENOUS | Status: AC
  Administered 2022-03-05: 1000 mL via INTRAVENOUS

## 2022-03-05 MED ORDER — ONDANSETRON HCL 4 MG/2ML IJ SOLN
4.0000 mg | Freq: Once | INTRAMUSCULAR | Status: DC
  Filled 2022-03-05: qty 2

## 2022-03-05 MED ORDER — MORPHINE SULFATE (PF) 4 MG/ML IV SOLN
4.0000 mg | Freq: Once | INTRAVENOUS | Status: DC
  Filled 2022-03-05: qty 1

## 2022-03-05 MED ORDER — KETOROLAC TROMETHAMINE 30 MG/ML IJ SOLN
15.0000 mg | Freq: Once | INTRAMUSCULAR | Status: DC
  Filled 2022-03-05: qty 1

## 2022-03-05 MED ORDER — ACETAMINOPHEN 500 MG PO TABS
1000.0000 mg | ORAL_TABLET | Freq: Once | ORAL | Status: DC
  Filled 2022-03-05: qty 2

## 2022-03-05 MED ORDER — IOHEXOL 350 MG/ML SOLN
100.0000 mL | Freq: Once | INTRAVENOUS | Status: AC | PRN
  Administered 2022-03-05: 100 mL via INTRAVENOUS

## 2022-03-05 MED ORDER — ONDANSETRON 4 MG PO TBDP: 4.0000 mg | ORAL_TABLET | Freq: Three times a day (TID) | ORAL | 0 refills | Status: DC | PRN

## 2022-03-05 NOTE — ED Provider Notes (Signed)
[facility] Provider Note    Event Date/Time   First MD Initiated Contact with Patient 03/05/22 2119     (approximate)   History   Abdominal Pain   HPI  Grant Hensley is a 36 y.o. male  with PMHx gastric bypass here with acute abdominal pani, nausea, near syncope. Pt reports that earlier today, he was in his usual state of health. He ate a ham sandwich at a holiday party that had apparently been out for possibly too long. Around 2-3 hours later, he developed acute, severe abdominal pain. He began having loose stool then vomiting with severe epigastric abdominal cramping. He vomited multiple times with profuse diarrhea. He felt cold, shaky, and even had episode where he felt lightheaded and near syncope with "blue lips' though he was conscious per family.        Physical Exam   Triage Vital Signs: ED Triage Vitals  Enc Vitals Group     BP 03/05/22 1950 115/86     Pulse Rate 03/05/22 1950 (!) 104     Resp 03/05/22 1950 20     Temp 03/05/22 1950 99.3 F (37.4 C)     Temp src --      SpO2 03/05/22 1950 98 %     Weight 03/05/22 1948 (!) 350 lb (158.8 kg)     Height 03/05/22 1948 5\' 10"  (1.778 m)     Head Circumference --      Peak Flow --      Pain Score 03/05/22 1947 8     Pain Loc --      Pain Edu? --      Excl. in GC? --     Most recent vital signs: Vitals:   03/05/22 2319 03/06/22 0022  BP: 107/64 106/70  Pulse: 84 91  Resp: 20 20  Temp:  99.7 F (37.6 C)  SpO2: 95% 95%     General: Awake, no distress.  CV:  Good peripheral perfusion. RRR. No murmurs. Resp:  Normal effort. Lungs clear bilaterally. Abd:  No distention. Minimal epigastric TTP. No rebound or guarding. Hyperactive bowel sounds. Other:  No LE edema.   ED Results / Procedures / Treatments   Labs (all labs ordered are listed, but only abnormal results are displayed) Labs Reviewed  COMPREHENSIVE METABOLIC PANEL - Abnormal; Notable for the following components:       Result Value   Glucose, Bld 153 (*)    BUN 22 (*)    All other components within normal limits  CBC - Abnormal; Notable for the following components:   WBC 11.6 (*)    RBC 5.93 (*)    All other components within normal limits  URINALYSIS, ROUTINE W REFLEX MICROSCOPIC - Abnormal; Notable for the following components:   Color, Urine AMBER (*)    APPearance HAZY (*)    All other components within normal limits  RESP PANEL BY RT-PCR (RSV, FLU A&B, COVID)  RVPGX2  CULTURE, BLOOD (ROUTINE X 2)  CULTURE, BLOOD (ROUTINE X 2)  LIPASE, BLOOD  LACTIC ACID, PLASMA  LACTIC ACID, PLASMA  TROPONIN I (HIGH SENSITIVITY)  TROPONIN I (HIGH SENSITIVITY)     EKG Normal sinus rhythm, VR 96. PR 132, QRS 94, QTc 434. No acute ST elevations or depressions. No ischemia or infarct.   RADIOLOGY CT A/P: Status post gastrojejunostomy, no bowel obstruction   I also independently reviewed and agree with radiologist interpretations.   PROCEDURES:  Critical Care performed: No  MEDICATIONS ORDERED IN ED: Medications  ondansetron (ZOFRAN) injection 4 mg (0 mg Intravenous Hold 03/05/22 2322)  morphine (PF) 4 MG/ML injection 4 mg (0 mg Intravenous Hold 03/05/22 2322)  ketorolac (TORADOL) 30 MG/ML injection 15 mg (0 mg Intravenous Hold 03/05/22 2321)  acetaminophen (TYLENOL) tablet 1,000 mg (0 mg Oral Hold 03/05/22 2321)  sodium chloride 0.9 % bolus 1,000 mL (0 mLs Intravenous Stopped 03/06/22 0023)  iohexol (OMNIPAQUE) 350 MG/ML injection 100 mL (100 mLs Intravenous Contrast Given 03/05/22 2226)  sodium chloride 0.9 % bolus 1,000 mL (0 mLs Intravenous Stopped 03/06/22 0023)     IMPRESSION / MDM / ASSESSMENT AND PLAN / ED COURSE  I reviewed the triage vital signs and the nursing notes.                              Differential diagnosis includes, but is not limited to, food borne illness, enteritis, colitis, diverticulitis, obstruction, choelcystitis, PUD, diverticulitis,  pancreatitis.  Patient's presentation is most consistent with acute presentation with potential threat to life or bodily function.  36 yo M PMHx gastric b ypass here with nausea, vomiting, diarrhea, near syncopal episode. Clinically, suspect food-borne illness with acute toxin-mediated n/v. Pt sx are now improved and he feels markedly better with IVF. LA normal. Labs very reassuring. Trop neg, EKG nonischemic, suspect his lightheadedness was more so from vagal rxn from the vomiting. LFTs normal. Renal function normal. Mild WBC 11.6 is likely reactive. CT A/P obtained, reviewed, and is negative for acute abnormality. Pt tolerating PO without difficulty.  Will d/c with supportive care, fluids, return precautions.  FINAL CLINICAL IMPRESSION(S) / ED DIAGNOSES   Final diagnoses:  Nausea vomiting and diarrhea  Dehydration     Rx / DC Orders   ED Discharge Orders          Ordered    ondansetron (ZOFRAN-ODT) 4 MG disintegrating tablet  Every 8 hours PRN        03/06/22 0031    ondansetron (ZOFRAN-ODT) 4 MG disintegrating tablet  Every 8 hours PRN,   Status:  Discontinued        03/05/22 2342             Note:  This document was prepared using Dragon voice recognition software and may include unintentional dictation errors.   Shaune Pollack, MD 03/06/22 [phone]

## 2022-03-05 NOTE — ED Triage Notes (Signed)
Pt reports abd pain with v/d that began today.  No back pain.  Denies urinary sx.  Vomited x 4 today.  Diarrhea x 6 today  pt alert

## 2022-03-06 MED ORDER — ONDANSETRON 4 MG PO TBDP: 4.0000 mg | ORAL_TABLET | Freq: Three times a day (TID) | ORAL | 0 refills | Status: AC | PRN

## 2022-03-10 LAB — CULTURE, BLOOD (ROUTINE X 2)
Culture: NO GROWTH
Culture: NO GROWTH

## 2023-05-04 ENCOUNTER — Ambulatory Visit
Admission: EM | Admit: 2023-05-04 | Discharge: 2023-05-04 | Disposition: A | Discharge status: Home / Self Care | Payer: Managed Care, Other (non HMO) | Attending: Emergency Medicine | Admitting: Emergency Medicine

## 2023-05-04 ENCOUNTER — Encounter: Payer: Self-pay | Admitting: *Deleted

## 2023-05-04 DIAGNOSIS — M62838 Other muscle spasm: Secondary | ICD-10-CM

## 2023-05-04 DIAGNOSIS — M436 Torticollis: Secondary | ICD-10-CM

## 2023-05-04 MED ORDER — CYCLOBENZAPRINE HCL 10 MG PO TABS: 10.0000 mg | ORAL_TABLET | Freq: Two times a day (BID) | ORAL | 0 refills | Status: AC | PRN

## 2023-05-04 NOTE — ED Triage Notes (Signed)
Patient states he slept wrong Thursday night and has spasms in right side of neck/shoulder, hoping for muscle relaxer until he can see chiropractor.

## 2023-05-04 NOTE — ED Provider Notes (Signed)
[facility]    CSN: [id number] Arrival date & time: 05/04/23  0906      History   Chief Complaint Chief Complaint  Patient presents with   Spasms    HPI Grant Hensley is a 38 y.o. male.   38 year old male patient, Grant Hensley, presents to urgent care for evaluation of neck shoulder spasms on the right side after sleeping wrong Thursday night.  Patient states he normally sees a chiropractor but unable to get in at this time , is requesting muscle relaxers.  The history is provided by the patient. No language interpreter was used.    Past Medical History:  Diagnosis Date   Chronic anal fissure    anal sphincterotomy 09/2015   Gout     Patient Active Problem List   Diagnosis Date Noted   Torticollis, acute 05/04/2023   Neck muscle spasm 05/04/2023   Chronic anal fissure 10/13/2015   Rectal pain 10/13/2015   Obesity 08/05/2013   Low serum vitamin D 08/05/2013    Past Surgical History:  Procedure Laterality Date   GASTRIC BYPASS  2015   KNEE SURGERY Left 2004   KNEE SURGERY Left 2004   SPHINCTEROTOMY N/A 09/26/2015   Procedure: SPHINCTEROTOMY;  Surgeon: Kieth Brightly, MD;  Location: [facility] ORS;  Service: General;  Laterality: N/A;       Home Medications    Prior to Admission medications   Medication Sig Start Date End Date Taking? Authorizing Provider  cyclobenzaprine (FLEXERIL) 10 MG tablet Take 1 tablet (10 mg total) by mouth 2 (two) times daily as needed for up to 5 days for muscle spasms. 05/04/23 05/09/23 Yes Kalisi Bevill, Para March, NP  allopurinol (ZYLOPRIM) 300 MG tablet Take 300 mg by mouth daily.    [provider]  docusate sodium (COLACE) 100 MG capsule Take 100 mg by mouth 2 (two) times daily.    [provider]  Multiple Vitamin (MULTIVITAMIN WITH MINERALS) TABS tablet Take 1 tablet by mouth daily.    [provider]  ondansetron (ZOFRAN-ODT) 4 MG disintegrating tablet Take 1 tablet (4 mg total) by  mouth every 8 (eight) hours as needed for nausea or vomiting. 03/06/22   Minna Antis, MD    Family History History reviewed. No pertinent family history.  Social History Social History   Tobacco Use   Smoking status: Never   Smokeless tobacco: Never  Vaping Use   Vaping status: Never Used  Substance Use Topics   Alcohol use: No    Alcohol/week: 0.0 standard drinks of alcohol   Drug use: No     Allergies   Patient has no known allergies.   Review of Systems Review of Systems  Constitutional:  Negative for fever.  Musculoskeletal:  Positive for myalgias, neck pain and neck stiffness.  Skin: Negative.   All other systems reviewed and are negative.    Physical Exam Triage Vital Signs ED Triage Vitals  Encounter Vitals Group     BP 05/04/23 0930 112/75     Systolic BP Percentile --      Diastolic BP Percentile --      Pulse Rate 05/04/23 0930 (!) 58     Resp 05/04/23 0930 18     Temp 05/04/23 0930 98.4 F (36.9 C)     Temp Source 05/04/23 0930 Oral     SpO2 05/04/23 0930 96 %     Weight 05/04/23 0928 (!) 350 lb (158.8 kg)     Height 05/04/23 0928 5\' 10"  (1.778  m)     Head Circumference --      Peak Flow --      Pain Score 05/04/23 0928 9     Pain Loc --      Pain Education --      Exclude from Growth Chart --    No data found.  Updated Vital Signs BP 112/75 (BP Location: Left Arm)   Pulse (!) 58   Temp 98.4 F (36.9 C) (Oral)   Resp 18   Ht 5\' 10"  (1.778 m)   Wt (!) 350 lb (158.8 kg)   SpO2 96%   BMI 50.22 kg/m   Visual Acuity Right Eye Distance:   Left Eye Distance:   Bilateral Distance:    Right Eye Near:   Left Eye Near:    Bilateral Near:     Physical Exam Vitals and nursing note reviewed.  Constitutional:      Appearance: He is well-developed and well-groomed.  Neck:     Trachea: Trachea normal.      Comments: +TTP Cardiovascular:     Rate and Rhythm: Normal rate.  Musculoskeletal:     Cervical back: Torticollis present.  No edema, erythema, signs of trauma, rigidity or crepitus. Pain with movement and muscular tenderness present. No spinous process tenderness. Decreased range of motion.  Neurological:     General: No focal deficit present.     Mental Status: He is alert and oriented to person, place, and time.     GCS: GCS eye subscore is 4. GCS verbal subscore is 5. GCS motor subscore is 6.     Cranial Nerves: No cranial nerve deficit.     Sensory: No sensory deficit.  Psychiatric:        Attention and Perception: Attention normal.        Mood and Affect: Mood normal.        Speech: Speech normal.        Behavior: Behavior normal. Behavior is cooperative.      UC Treatments / Results  Labs (all labs ordered are listed, but only abnormal results are displayed) Labs Reviewed - No data to display  EKG   Radiology No results found.  Procedures Procedures (including critical care time)  Medications Ordered in UC Medications - No data to display  Initial Impression / Assessment and Plan / UC Course  I have reviewed the triage vital signs and the nursing notes.  Pertinent labs & imaging results that were available during my care of the patient were reviewed by me and considered in my medical decision making (see chart for details).    Discussed exam findings and plan of care with patient, strict go to ER precautions given.   Patient verbalized understanding to this provider.  Ddx : Torticollis, neck muscle spasm Final Clinical Impressions(s) / UC Diagnoses   Final diagnoses:  Torticollis, acute  Neck muscle spasm     Discharge Instructions      Take flexeril as directed,may cause drowsiness Ice to neck 20 min 3 x daily, ma switch to heat in 2 days Biofreeze or lidocaine as label directed(over the counter) Drink plenty of water Massage therapy may help     ED Prescriptions     Medication Sig Dispense Auth. Provider   cyclobenzaprine (FLEXERIL) 10 MG tablet Take 1 tablet (10  mg total) by mouth 2 (two) times daily as needed for up to 5 days for muscle spasms. 10 tablet Neelie Welshans, Para March, NP      PDMP  not reviewed this encounter.   Clancy Gourd, NP 05/04/23 1145

## 2023-05-04 NOTE — Discharge Instructions (Addendum)
Take flexeril as directed,may cause drowsiness Ice to neck 20 min 3 x daily, ma switch to heat in 2 days Biofreeze or lidocaine as label directed(over the counter) Drink plenty of water Massage therapy may help
